# Patient Record
Sex: Female | Born: 1990 | Race: White | Hispanic: No | Marital: Single | Smoking: Former smoker
Health system: Southern US, Community
[De-identification: ages and names within clinical notes are randomized; demographics above are authoritative.]

## PROBLEM LIST (undated history)

## (undated) DIAGNOSIS — F419 Anxiety disorder, unspecified: Secondary | ICD-10-CM

## (undated) HISTORY — PX: TONSILLECTOMY: SUR1361

## (undated) HISTORY — PX: MYRINGOTOMY WITH TUBE PLACEMENT: SHX5663

---
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
## 2011-05-24 ENCOUNTER — Emergency Department (HOSPITAL_COMMUNITY): Payer: Self-pay

## 2011-05-24 ENCOUNTER — Emergency Department (HOSPITAL_COMMUNITY)
Admission: EM | Admit: 2011-05-24 | Discharge: 2011-05-24 | Disposition: A | Discharge status: Home / Self Care | Payer: No Typology Code available for payment source | Attending: Emergency Medicine | Admitting: Emergency Medicine

## 2011-05-24 DIAGNOSIS — F411 Generalized anxiety disorder: Secondary | ICD-10-CM | POA: Insufficient documentation

## 2011-05-24 DIAGNOSIS — R079 Chest pain, unspecified: Secondary | ICD-10-CM | POA: Insufficient documentation

## 2011-05-24 DIAGNOSIS — R109 Unspecified abdominal pain: Secondary | ICD-10-CM | POA: Insufficient documentation

## 2011-05-24 DIAGNOSIS — M25539 Pain in unspecified wrist: Secondary | ICD-10-CM | POA: Insufficient documentation

## 2011-05-24 DIAGNOSIS — S139XXA Sprain of joints and ligaments of unspecified parts of neck, initial encounter: Secondary | ICD-10-CM | POA: Insufficient documentation

## 2011-05-24 DIAGNOSIS — M542 Cervicalgia: Secondary | ICD-10-CM | POA: Insufficient documentation

## 2011-05-24 DIAGNOSIS — S20219A Contusion of unspecified front wall of thorax, initial encounter: Secondary | ICD-10-CM | POA: Insufficient documentation

## 2011-05-24 DIAGNOSIS — M25519 Pain in unspecified shoulder: Secondary | ICD-10-CM | POA: Insufficient documentation

## 2011-05-24 DIAGNOSIS — S301XXA Contusion of abdominal wall, initial encounter: Secondary | ICD-10-CM | POA: Insufficient documentation

## 2011-05-24 LAB — POCT I-STAT, CHEM 8
BUN: 14 mg/dL (ref 6–23)
Chloride: 107 mEq/L (ref 96–112)
Creatinine, Ser: 1 mg/dL (ref 0.50–1.10)
Potassium: 3.9 mEq/L (ref 3.5–5.1)
Sodium: 143 mEq/L (ref 135–145)

## 2011-05-24 LAB — POCT PREGNANCY, URINE: Preg Test, Ur: NEGATIVE

## 2011-05-24 MED ORDER — IOHEXOL 300 MG/ML  SOLN
100.0000 mL | Freq: Once | INTRAMUSCULAR | Status: AC | PRN
  Administered 2011-05-24: 100 mL via INTRAVENOUS
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
## 2017-10-07 ENCOUNTER — Emergency Department (INDEPENDENT_AMBULATORY_CARE_PROVIDER_SITE_OTHER)
Admission: EM | Admit: 2017-10-07 | Discharge: 2017-10-07 | Disposition: A | Discharge status: Home / Self Care | Payer: BLUE CROSS/BLUE SHIELD | Source: Home / Self Care | Attending: Family Medicine | Admitting: Family Medicine

## 2017-10-07 ENCOUNTER — Encounter: Payer: Self-pay | Admitting: *Deleted

## 2017-10-07 ENCOUNTER — Other Ambulatory Visit: Payer: Self-pay

## 2017-10-07 DIAGNOSIS — H6591 Unspecified nonsuppurative otitis media, right ear: Secondary | ICD-10-CM

## 2017-10-07 DIAGNOSIS — H6592 Unspecified nonsuppurative otitis media, left ear: Secondary | ICD-10-CM | POA: Diagnosis not present

## 2017-10-07 DIAGNOSIS — J069 Acute upper respiratory infection, unspecified: Secondary | ICD-10-CM

## 2017-10-07 DIAGNOSIS — B9789 Other viral agents as the cause of diseases classified elsewhere: Secondary | ICD-10-CM

## 2017-10-07 HISTORY — DX: Anxiety disorder, unspecified: F41.9

## 2017-10-07 MED ORDER — AMOXICILLIN 875 MG PO TABS: 875.0000 mg | ORAL_TABLET | Freq: Two times a day (BID) | ORAL | 0 refills | Status: DC

## 2017-10-07 MED ORDER — HYDROCODONE-ACETAMINOPHEN 5-325 MG PO TABS: 1.0000 | ORAL_TABLET | Freq: Four times a day (QID) | ORAL | 0 refills | Status: DC | PRN

## 2017-10-07 NOTE — ED Provider Notes (Signed)
[facility]    CSN: [id number] Arrival date & time: 10/07/17  1855     History   Chief Complaint Chief Complaint  Patient presents with  . Otalgia    HPI Ashley Frazier is a 26 y.o. female.   Patient complains of six day history of typical cold-like symptoms developing over several days, including mild sore throat, sinus congestion, headache, fatigue, and cough.  Today her left ear popped, and has now become painful.  Her right ear feels full.  She has a past history of frequent childhood otitis media.   The history is provided by the patient.    Past Medical History:  Diagnosis Date  . Anxiety     There are no active problems to display for this patient.   Past Surgical History:  Procedure Laterality Date  . CESAREAN SECTION     x 2  . MYRINGOTOMY WITH TUBE PLACEMENT    . TONSILLECTOMY      OB History    No data available       Home Medications    Prior to Admission medications   Medication Sig Start Date End Date Taking? Authorizing Provider  escitalopram (LEXAPRO) 10 MG tablet Take 10 mg by mouth daily.   Yes [provider]  amoxicillin (AMOXIL) 875 MG tablet Take 1 tablet (875 mg total) by mouth 2 (two) times daily. 10/07/17   Lattie HawBeese, Conley Delisle A, MD  HYDROcodone-acetaminophen (NORCO/VICODIN) 5-325 MG tablet Take 1 tablet by mouth every 6 (six) hours as needed for moderate pain. 10/07/17   Lattie HawBeese, Camdin Hegner A, MD    Family History Family History  Problem Relation Age of Onset  . Cancer Mother        ovarian    Social History Social History   Tobacco Use  . Smoking status: Former Games developermoker  . Smokeless tobacco: Never Used  Substance Use Topics  . Alcohol use: No    Frequency: Never  . Drug use: No     Allergies   Patient has no known allergies.   Review of Systems Review of Systems + sore throat + cough No pleuritic pain No wheezing + nasal congestion + post-nasal drainage + sinus pain/pressure No itchy/red  eyes + left earache No hemoptysis No SOB No fever/chills No nausea No vomiting No abdominal pain No diarrhea No urinary symptoms No skin rash + fatigue No myalgias + headache Used OTC meds without relief   Physical Exam Triage Vital Signs ED Triage Vitals [10/07/17 1909]  Enc Vitals Group     BP 137/88     Pulse Rate 69     Resp 16     Temp 97.6 F (36.4 C)     Temp Source Oral     SpO2 98 %     Weight 291 lb (132 kg)     Height 5\' 9"  (1.753 m)     Head Circumference      Peak Flow      Pain Score 9     Pain Loc      Pain Edu?      Excl. in GC?    No data found.  Updated Vital Signs BP 137/88 (BP Location: Left Arm)   Pulse 69   Temp 97.6 F (36.4 C) (Oral)   Resp 16   Ht 5\' 9"  (1.753 m)   Wt 291 lb (132 kg)   SpO2 98%   BMI 42.97 kg/m   Visual Acuity Right Eye Distance:  Left Eye Distance:   Bilateral Distance:    Right Eye Near:   Left Eye Near:    Bilateral Near:     Physical Exam Nursing notes and Vital Signs reviewed. Appearance:  Patient appears stated age, and in no acute distress Eyes:  Pupils are equal, round, and reactive to light and accomodation.  Extraocular movement is intact.  Conjunctivae are not inflamed  Ears:  Canals normal.  Right tympanic membrane has serous effusion.  Left tympanic membrane is erythematous and bulging.  Nose:  Congested turbinates.  No sinus tenderness.   Marland Kitchen.  Pharynx:  Normal Neck:  Supple.  Enlarged posterior/lateral nodes are palpated bilaterally, tender to palpation on the left.   Lungs:  Clear to auscultation.  Breath sounds are equal.  Moving air well. Heart:  Regular rate and rhythm without murmurs, rubs, or gallops.  Abdomen:  Nontender without masses or hepatosplenomegaly.  Bowel sounds are present.  No CVA or flank tenderness.  Extremities:  No edema.  Skin:  No rash present.    UC Treatments / Results  Labs (all labs ordered are listed, but only abnormal results are displayed) Labs Reviewed -  No data to display  EKG  EKG Interpretation None       Radiology No results found.  Procedures Procedures (including critical care time)  Medications Ordered in UC Medications - No data to display   Initial Impression / Assessment and Plan / UC Course  I have reviewed the triage vital signs and the nursing notes.  Pertinent labs & imaging results that were available during my care of the patient were reviewed by me and considered in my medical decision making (see chart for details).    Begin amoxicillin. Rx for Lortab for pain. Controlled Substance Prescriptions I have consulted the [place] Controlled Substances Registry for this patient, and feel the risk/benefit ratio today is favorable for proceeding with this prescription for a controlled substance.  Take plain guaifenesin (1200mg  extended release tabs such as Mucinex) twice daily, with plenty of water, for cough and congestion.  May add Pseudoephedrine (30mg , one or two every 4 to 6 hours) for sinus congestion.  Get adequate rest.   May use Afrin nasal spray (or generic oxymetazoline) each morning for about 5 days and then discontinue.  Also recommend using saline nasal spray several times daily and saline nasal irrigation (AYR is a common brand).  Use Flonase nasal spray each morning after using Afrin nasal spray and saline nasal irrigation. Try warm salt water gargles for sore throat.  Stop all antihistamines for now, and other non-prescription cough/cold preparations. May take Ibuprofen 200mg , 4 tabs every 8 hours with food for pain. May take Delsym Cough Suppressant at bedtime for nighttime cough.  Follow-up with family doctor if not improving about 7 to 10 days.     Final Clinical Impressions(s) / UC Diagnoses   Final diagnoses:  Left otitis media with effusion  Right serous otitis media, unspecified chronicity  Viral URI with cough    ED Discharge Orders        Ordered    amoxicillin (AMOXIL) 875 MG tablet  2  times daily     10/07/17 1927    HYDROcodone-acetaminophen (NORCO/VICODIN) 5-325 MG tablet  Every 6 hours PRN     10/07/17 1927          Lattie HawBeese, Hollye Pritt A, MD 10/07/17 1940

## 2017-10-07 NOTE — Discharge Instructions (Signed)
Take plain guaifenesin (1200mg  extended release tabs such as Mucinex) twice daily, with plenty of water, for cough and congestion.  May add Pseudoephedrine (30mg , one or two every 4 to 6 hours) for sinus congestion.  Get adequate rest.   May use Afrin nasal spray (or generic oxymetazoline) each morning for about 5 days and then discontinue.  Also recommend using saline nasal spray several times daily and saline nasal irrigation (AYR is a common brand).  Use Flonase nasal spray each morning after using Afrin nasal spray and saline nasal irrigation. Try warm salt water gargles for sore throat.  Stop all antihistamines for now, and other non-prescription cough/cold preparations. May take Ibuprofen 200mg , 4 tabs every 8 hours with food for pain. May take Delsym Cough Suppressant at bedtime for nighttime cough.  Follow-up with family doctor if not improving about 7 to 10 days.

## 2017-10-07 NOTE — ED Triage Notes (Signed)
Pt c/o LT ear pain x 1600 today. She currently has a URI and developed ear pain after blowing her nose. She took IBF at 1630.

## 2017-10-08 ENCOUNTER — Telehealth: Payer: Self-pay | Admitting: Family Medicine

## 2017-12-14 ENCOUNTER — Encounter (HOSPITAL_BASED_OUTPATIENT_CLINIC_OR_DEPARTMENT_OTHER): Payer: Self-pay

## 2017-12-14 ENCOUNTER — Emergency Department (HOSPITAL_BASED_OUTPATIENT_CLINIC_OR_DEPARTMENT_OTHER)
Admission: EM | Admit: 2017-12-14 | Discharge: 2017-12-14 | Disposition: A | Discharge status: Home / Self Care | Payer: BLUE CROSS/BLUE SHIELD | Attending: Physician Assistant | Admitting: Physician Assistant

## 2017-12-14 ENCOUNTER — Other Ambulatory Visit: Payer: Self-pay

## 2017-12-14 DIAGNOSIS — B9689 Other specified bacterial agents as the cause of diseases classified elsewhere: Secondary | ICD-10-CM

## 2017-12-14 DIAGNOSIS — N760 Acute vaginitis: Secondary | ICD-10-CM | POA: Insufficient documentation

## 2017-12-14 DIAGNOSIS — Z202 Contact with and (suspected) exposure to infections with a predominantly sexual mode of transmission: Secondary | ICD-10-CM | POA: Diagnosis present

## 2017-12-14 LAB — PREGNANCY, URINE: PREG TEST UR: NEGATIVE

## 2017-12-14 LAB — WET PREP, GENITAL
Sperm: NONE SEEN
Trich, Wet Prep: NONE SEEN
YEAST WET PREP: NONE SEEN

## 2017-12-14 LAB — URINALYSIS, ROUTINE W REFLEX MICROSCOPIC
Bilirubin Urine: NEGATIVE
Glucose, UA: NEGATIVE mg/dL
HGB URINE DIPSTICK: NEGATIVE
KETONES UR: NEGATIVE mg/dL
LEUKOCYTES UA: NEGATIVE
Nitrite: NEGATIVE
PROTEIN: NEGATIVE mg/dL
Specific Gravity, Urine: 1.025 (ref 1.005–1.030)
pH: 6.5 (ref 5.0–8.0)

## 2017-12-14 MED ORDER — AZITHROMYCIN 250 MG PO TABS
1000.0000 mg | ORAL_TABLET | Freq: Once | ORAL | Status: AC
  Administered 2017-12-14: 1000 mg via ORAL
  Filled 2017-12-14: qty 4

## 2017-12-14 MED ORDER — METRONIDAZOLE 500 MG PO TABS: 500.0000 mg | ORAL_TABLET | Freq: Two times a day (BID) | ORAL | 0 refills | Status: AC

## 2017-12-14 MED ORDER — LIDOCAINE HCL (PF) 1 % IJ SOLN
INTRAMUSCULAR | Status: AC
  Administered 2017-12-14: 0.9 mL
  Filled 2017-12-14: qty 5

## 2017-12-14 MED ORDER — CEFTRIAXONE SODIUM 250 MG IJ SOLR
250.0000 mg | Freq: Once | INTRAMUSCULAR | Status: AC
  Administered 2017-12-14: 250 mg via INTRAMUSCULAR
  Filled 2017-12-14: qty 250
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
## 2017-12-14 NOTE — ED Triage Notes (Signed)
Pt states she had + STD exposure-vaginal d/c x 3 days-NAD-steady gait

## 2017-12-14 NOTE — Discharge Instructions (Signed)
You are found to have bacterial vaginosis.  We treated you for STDs as well.

## 2017-12-14 NOTE — ED Notes (Signed)
Pt verbalizes understanding of d/c instructions and denies any further needs at this time. 

## 2017-12-15 LAB — GC/CHLAMYDIA PROBE AMP (~~LOC~~) NOT AT ARMC
CHLAMYDIA, DNA PROBE: NEGATIVE
NEISSERIA GONORRHEA: NEGATIVE

## 2017-12-15 LAB — RPR: RPR: NONREACTIVE

## 2017-12-15 LAB — HIV ANTIBODY (ROUTINE TESTING W REFLEX): HIV SCREEN 4TH GENERATION: NONREACTIVE

## 2018-05-03 NOTE — Telephone Encounter (Signed)
Error
